# Patient Record
Sex: Female | Born: 1995 | Race: Black or African American | Hispanic: No | Marital: Single | Smoking: Never smoker
Health system: Southern US, Community
[De-identification: ages and names within clinical notes are randomized; demographics above are authoritative.]

## PROBLEM LIST (undated history)

## (undated) ENCOUNTER — Inpatient Hospital Stay (HOSPITAL_COMMUNITY): Payer: Self-pay

## (undated) DIAGNOSIS — J45909 Unspecified asthma, uncomplicated: Secondary | ICD-10-CM

---
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
## 2018-04-28 ENCOUNTER — Encounter (HOSPITAL_COMMUNITY): Payer: Self-pay | Admitting: *Deleted

## 2018-04-28 ENCOUNTER — Inpatient Hospital Stay (HOSPITAL_COMMUNITY)
Admission: AD | Admit: 2018-04-28 | Discharge: 2018-04-28 | Disposition: A | Discharge status: Home / Self Care | Payer: Medicaid Other | Attending: Obstetrics & Gynecology | Admitting: Obstetrics & Gynecology

## 2018-04-28 DIAGNOSIS — J101 Influenza due to other identified influenza virus with other respiratory manifestations: Secondary | ICD-10-CM

## 2018-04-28 DIAGNOSIS — J111 Influenza due to unidentified influenza virus with other respiratory manifestations: Secondary | ICD-10-CM | POA: Insufficient documentation

## 2018-04-28 DIAGNOSIS — Z3689 Encounter for other specified antenatal screening: Secondary | ICD-10-CM

## 2018-04-28 DIAGNOSIS — O26893 Other specified pregnancy related conditions, third trimester: Secondary | ICD-10-CM

## 2018-04-28 DIAGNOSIS — R05 Cough: Secondary | ICD-10-CM | POA: Diagnosis present

## 2018-04-28 DIAGNOSIS — O99513 Diseases of the respiratory system complicating pregnancy, third trimester: Secondary | ICD-10-CM | POA: Insufficient documentation

## 2018-04-28 DIAGNOSIS — Z3A34 34 weeks gestation of pregnancy: Secondary | ICD-10-CM | POA: Diagnosis not present

## 2018-04-28 LAB — URINALYSIS, ROUTINE W REFLEX MICROSCOPIC
Bilirubin Urine: NEGATIVE
GLUCOSE, UA: NEGATIVE mg/dL
Hgb urine dipstick: NEGATIVE
Ketones, ur: 5 mg/dL — AB
Nitrite: NEGATIVE
PH: 6 (ref 5.0–8.0)
Protein, ur: NEGATIVE mg/dL
Specific Gravity, Urine: 1.021 (ref 1.005–1.030)

## 2018-04-28 LAB — INFLUENZA PANEL BY PCR (TYPE A & B)
Influenza A By PCR: NEGATIVE
Influenza B By PCR: POSITIVE — AB

## 2018-04-28 LAB — GROUP A STREP BY PCR: GROUP A STREP BY PCR: NOT DETECTED

## 2018-04-28 MED ORDER — OSELTAMIVIR PHOSPHATE 75 MG PO CAPS: 75.0000 mg | ORAL_CAPSULE | Freq: Two times a day (BID) | ORAL | 0 refills | Status: AC

## 2018-04-28 NOTE — Discharge Instructions (Signed)
Pregnancy and Influenza    Influenza, also called the flu, is an infection of the lungs and airways (respiratory tract). If you are pregnant, you are more likely to catch the flu. You are also more likely to have a more serious case of the flu. This is because pregnancy causes changes to your body's disease-fighting system (immune system), heart, and lungs. If you develop a bad case of the flu, especially with a high fever, this can cause problems for you and your developing baby.  How do people get the flu?  The flu is caused by a type of germ called a virus. It spreads when virus particles get passed from person to person by:   Being near a sick person who is coughing or sneezing.   Touching something that has the virus on it and then touching your mouth, nose, or face.  The influenza virus is most common during the fall and winter.  How can I protect myself against the flu?   Get a flu shot. The best way to prevent the flu is to get a flu shot before flu season starts. The flu shot is not dangerous for your developing baby. It may even help protect your baby from the flu for up to 6 months after birth.   Wash your hands often with soap and warm water. If soap and water are not available, use hand sanitizer.   Do not come in close contact with sick people.   Do not share food, drinks, or utensils with other people.   Avoid touching your eyes, nose, and mouth.   Clean frequently used surfaces at home, school, or work.   Practice healthy lifestyle habits, such as:  ? Eating a healthy, balanced diet.  ? Drinking plenty of fluids.  ? Exercising regularly or as told by your health care provider.  ? Sleeping 7-9 hours each night.  ? Finding ways to manage stress.  What should I do if I have flu symptoms?   If you have any symptoms of the flu, even after getting a flu shot, contact your health care provider right away.   To reduce fever, take over-the-counter acetaminophen as told by your health care  provider.   If you have the flu, you may get antiviral medicine to keep the flu from becoming severe and to shorten how long it lasts.   Avoid spreading the flu to others:  ? Stay home until you are well.  ? Cover your nose and mouth when you cough or sneeze.  ? Wash your hands often.  Follow these instructions at home:   Take over-the-counter and prescription medicines only as told by your health care provider. Do not take any medicine, including cold or flu medicine, unless your health care provider tells you to do so.   If you were prescribed antiviral medicine, take it as told by your health care provider. Do not stop taking the antiviral medicine even if you start to feel better.   Eat a nutrient-rich diet that includes fresh fruits and vegetables, whole grains, lean protein, and low-fat dairy.   Drink enough fluid to keep your urine clear or pale yellow.   Get plenty of rest.  Contact a health care provider if:   You have fever or chills.   You have a cough, sore throat, or stuffy nose.   You have worsening or unusual:  ? Muscle aches.  ? Headache.  ? Tiredness.  ? Loss of appetite.   You   have vomiting or diarrhea.  Get help right away if:   You have trouble breathing.   You have chest pain.   You have abdominal pain.   You begin to have labor pains.   You have a fever that does not go down 24 hours after you take medicine.   You do not feel your baby move.   You have diarrhea or vomiting that will not go away.   You have dizziness or confusion.   Your symptoms do not improve, even with treatment.  Summary   If you are pregnant, you are more likely to catch the flu. You are also more likely to have a more serious case of the flu.   If you have flu-like symptoms, call your health care provider right away. If you develop a bad case of the flu, especially with a high fever, this can be dangerous for your developing baby.   The best way to prevent the flu is to get a flu shot before flu  season starts. The flu shot is not dangerous for your developing baby.   If you have the flu and were prescribed antiviral medicine, take it as told by your health care provider.  This information is not intended to replace advice given to you by your health care provider. Make sure you discuss any questions you have with your health care provider.  Document Released: 02/20/2008 Document Revised: 06/15/2016 Document Reviewed: 06/15/2016  [employer]vier Interactive Patient Education  2019 [employer]

## 2018-04-28 NOTE — MAU Provider Note (Signed)
History     CSN: [id number]  Arrival date and time: 04/28/18 1400   First Provider Initiated Contact with Patient 04/28/18 1503      Chief Complaint  Patient presents with  . Chills  . Cough  . Sore Throat  . Nasal Congestion   G2P0100 @34  wks here with cold sx. Reports non-productive cough, nasal congestion, chills, and body aches x2 days. Two family members have same sx. Has felt hot but did not check temp. Reports decreased FM today but felling good movement since arrival. Took Tylenol yesterday.   OB History    Gravida  2   Para  1   Term  0   Preterm  1   AB  0   Living  0     SAB  0   TAB  0   Ectopic  0   Multiple  0   Live Births  1           Past Medical History:  Diagnosis Date  . Asthma     Past Surgical History:  Procedure Laterality Date  . CESAREAN SECTION N/A 07/09/2016   Procedure: CESAREAN SECTION;  Surgeon: Adam PhenixJames G Arnold, MD;  [facility];  Service: Obstetrics;  Laterality: N/A;    Family History  Problem Relation Age of Onset  . Diabetes Paternal Grandmother     Social History   Tobacco Use  . Smoking status: Never Smoker  . Smokeless tobacco: Never Used  Substance Use Topics  . Alcohol use: No  . Drug use: No    Allergies: No Known Allergies  No medications prior to admission.    Review of Systems  Constitutional: Positive for chills. Negative for fever.  HENT: Positive for congestion and sore throat. Negative for ear pain.   Respiratory: Positive for cough and shortness of breath.   Neurological: Negative for headaches.   Physical Exam   Blood pressure 111/69, pulse (!) 122, temperature 100.1 F (37.8 C), temperature source Oral, resp. rate 18, height 5\' 5"  (1.651 m), weight 62.1 kg, SpO2 100 %.  Physical Exam  Constitutional: She is oriented to person, place, and time. She appears well-developed and well-nourished. No distress.  HENT:  Head: Normocephalic and atraumatic.  Right Ear:  Hearing, tympanic membrane, external ear and ear canal normal.  Left Ear: Hearing, tympanic membrane, external ear and ear canal normal.  Nose: Nose normal.  Mouth/Throat: Uvula is midline, oropharynx is clear and moist and mucous membranes are normal.  Neck: Normal range of motion.  Cardiovascular: Regular rhythm and normal heart sounds.  Mild tachy  Respiratory: Effort normal and breath sounds normal. No respiratory distress. She has no wheezes. She has no rales.  Musculoskeletal: Normal range of motion.  Neurological: She is alert and oriented to person, place, and time.  Skin: Skin is warm and dry.  Psychiatric: She has a normal mood and affect.  EFM: 155 bpm, mod variability, + accels, no decels Toco: none  Results for orders placed or performed during the hospital encounter of 04/28/18 (from the past 24 hour(s))  Urinalysis, Routine w reflex microscopic     Status: Abnormal   Collection Time: 04/28/18  2:28 PM  Result Value Ref Range   Color, Urine YELLOW YELLOW   APPearance HAZY (A) CLEAR   Specific Gravity, Urine 1.021 1.005 - 1.030   pH 6.0 5.0 - 8.0   Glucose, UA NEGATIVE NEGATIVE mg/dL   Hgb urine dipstick NEGATIVE NEGATIVE   Bilirubin Urine  NEGATIVE NEGATIVE   Ketones, ur 5 (A) NEGATIVE mg/dL   Protein, ur NEGATIVE NEGATIVE mg/dL   Nitrite NEGATIVE NEGATIVE   Leukocytes, UA LARGE (A) NEGATIVE   RBC / HPF 6-10 0 - 5 RBC/hpf   WBC, UA 21-50 0 - 5 WBC/hpf   Bacteria, UA RARE (A) NONE SEEN   Squamous Epithelial / LPF 6-10 0 - 5   Mucus PRESENT   Influenza panel by PCR (type A & B)     Status: Abnormal   Collection Time: 04/28/18  3:13 PM  Result Value Ref Range   Influenza A By PCR NEGATIVE NEGATIVE   Influenza B By PCR POSITIVE (A) NEGATIVE  Group A Strep by PCR     Status: None   Collection Time: 04/28/18  3:13 PM  Result Value Ref Range   Group A Strep by PCR NOT DETECTED NOT DETECTED   MAU Course  Procedures  MDM Labs ordered and reviewed. Flu B pos, will  treat with Tamiflu. Stable for discharge home.   Assessment and Plan   1. [redacted] weeks gestation of pregnancy   2. Influenza B   3. NST (non-stress test) reactive    Discharge home Follow up in OB office as scheduled Rx Tamiflu Tylenol prn Hydrate Rest Return for worsening sx  Allergies as of 04/28/2018   No Known Allergies     Medication List    STOP taking these medications   amLODipine 10 MG tablet Commonly known as:  NORVASC   ibuprofen 600 MG tablet Commonly known as:  ADVIL,MOTRIN   Oxycodone HCl 10 MG Tabs   senna-docusate 8.6-50 MG tablet Commonly known as:  Senokot-S     TAKE these medications   albuterol 108 (90 Base) MCG/ACT inhaler Commonly known as:  PROVENTIL HFA;VENTOLIN HFA Inhale 1-2 puffs into the lungs every 6 (six) hours as needed for wheezing or shortness of breath.   calcium carbonate 500 MG chewable tablet Commonly known as:  TUMS - dosed in mg elemental calcium Chew 2 tablets by mouth as needed for indigestion or heartburn.   OB COMPLETE PETITE 35-5-1-200 MG Caps Take 1 tablet by mouth daily.   oseltamivir 75 MG capsule Commonly known as:  TAMIFLU Take 1 capsule (75 mg total) by mouth 2 (two) times daily for 5 days.      Donette LarryMelanie Deyra Perdomo, CNM 04/28/2018, 6:10 PM

## 2018-04-28 NOTE — MAU Note (Signed)
Patient reports having sore throat, nasal congestion, cough, chills, and body aches since 04/26/18.  States her father and brother have also had these symptoms.  Reports decreased fetal movement as well.  Denies getting flu shot.

## 2018-08-15 IMAGING — US US MFM UA CORD DOPPLER
1 series · 15 of 28 positions shown · non-contrast
Comparison: none

[Series 1: us mfm ua cord doppler · 34 acquisitions, 15 frames shown]
[im 1/34]
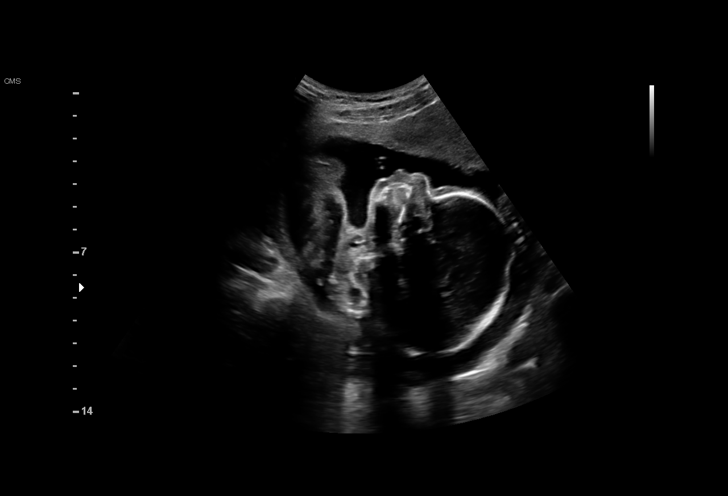
[im 3/34]
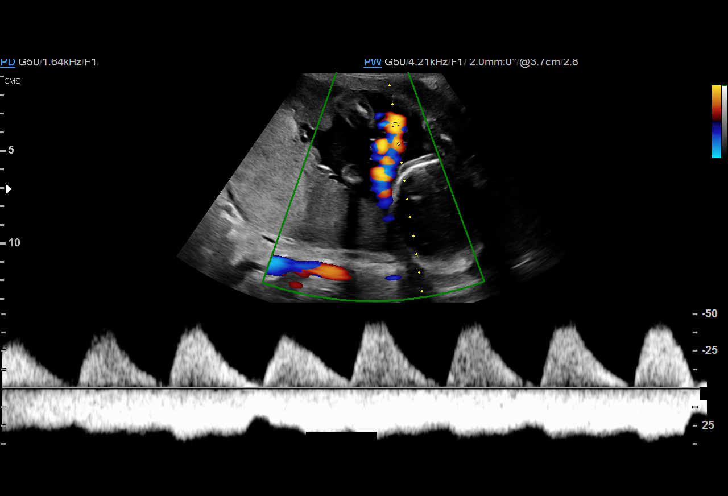
[im 5/34]
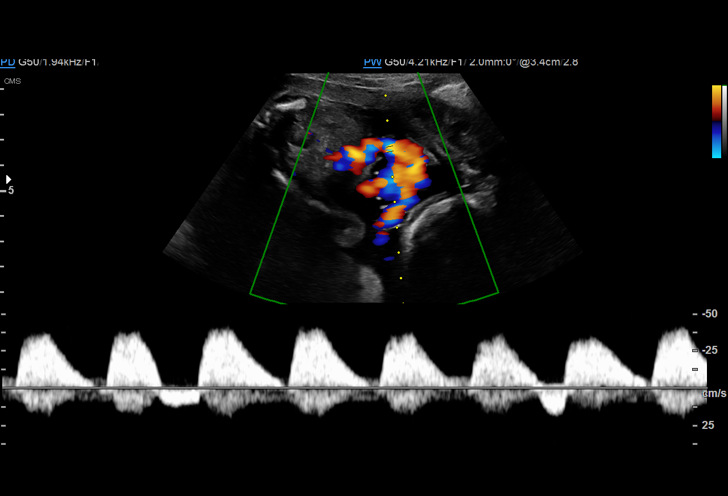
[im 8/34]
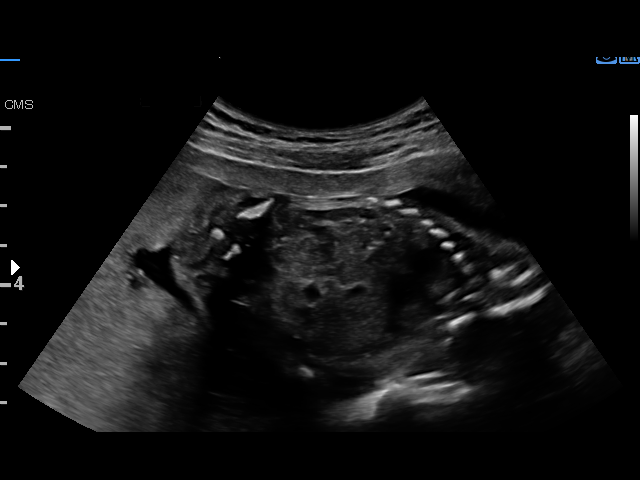
[im 10/34]
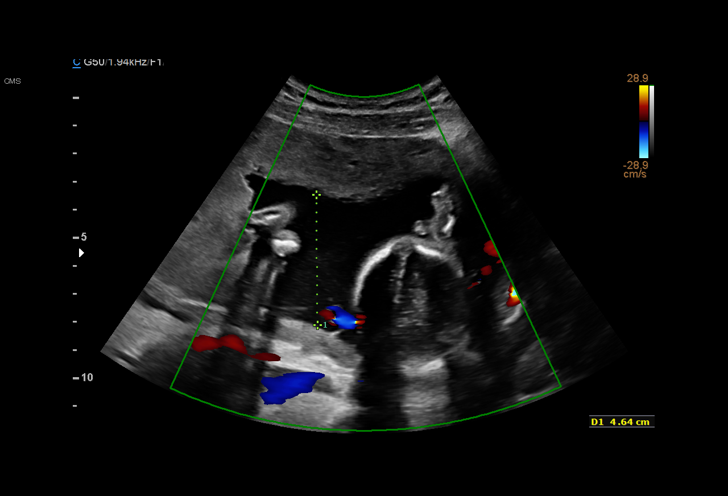
[im 13/34]
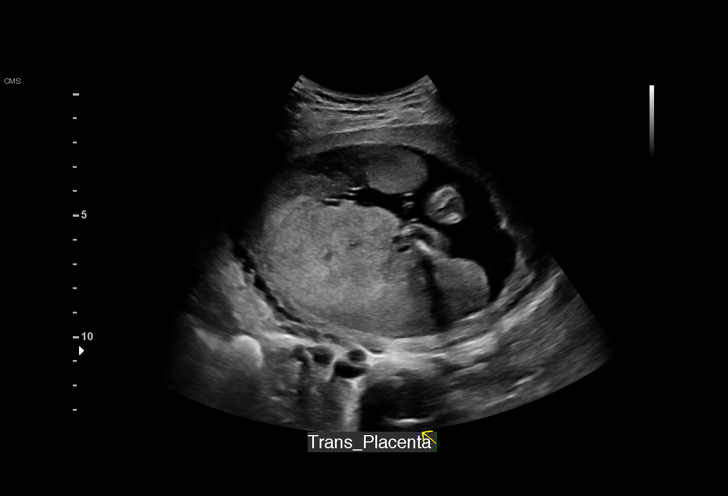
[im 15/34]
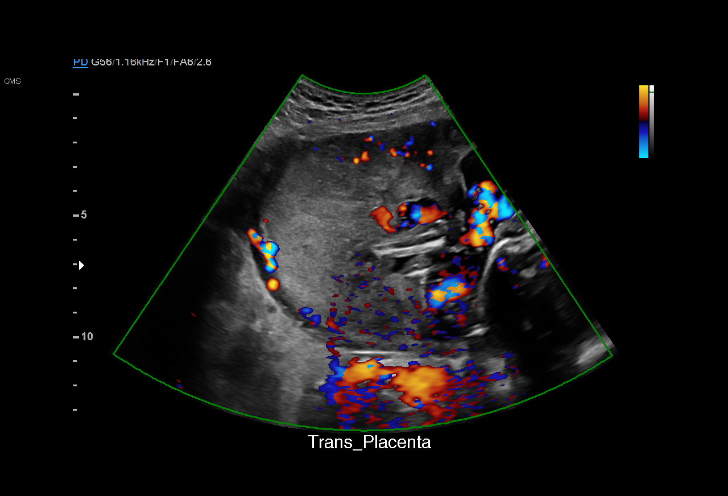
[im 18/34]
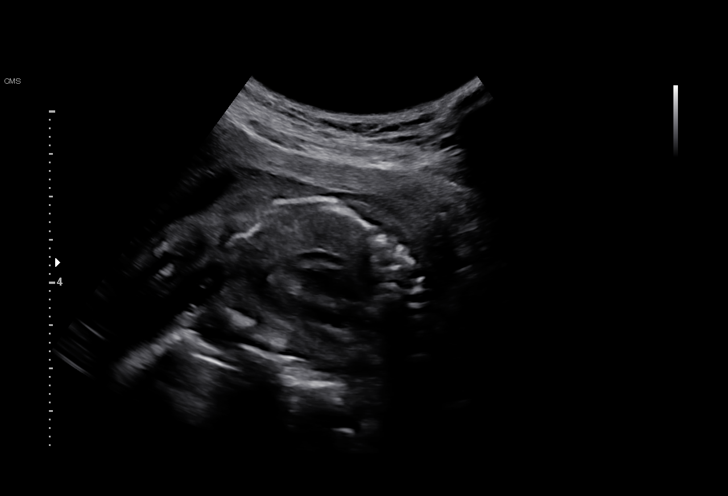
[im 19/34]
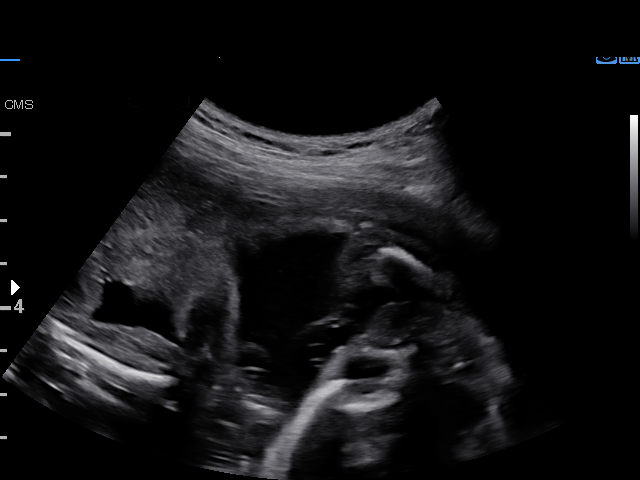
[im 21/34]
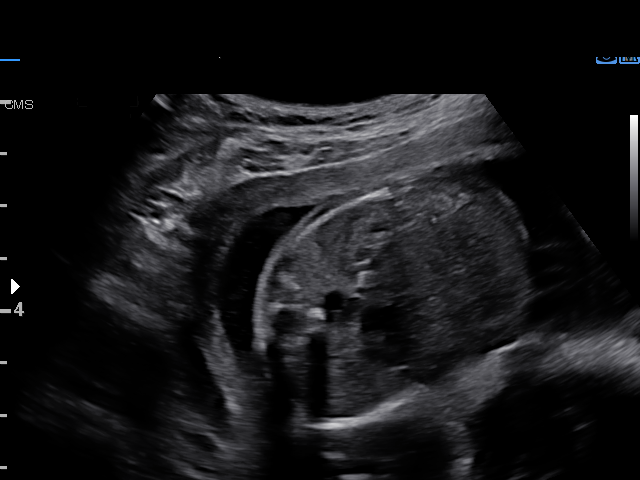
[im 24/34]
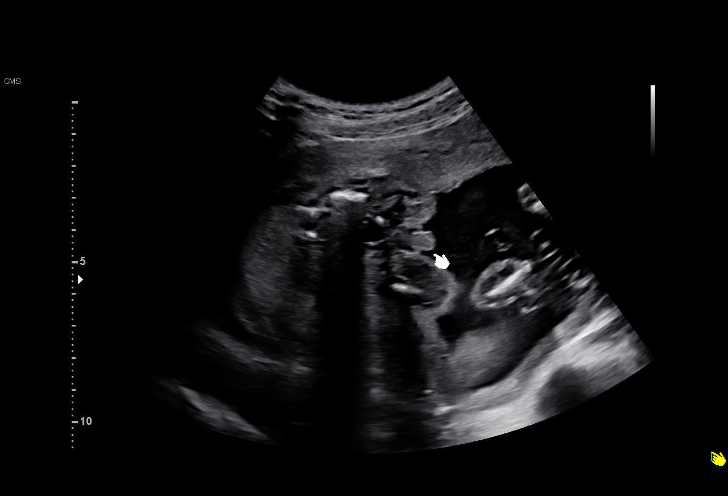
[im 26/34]
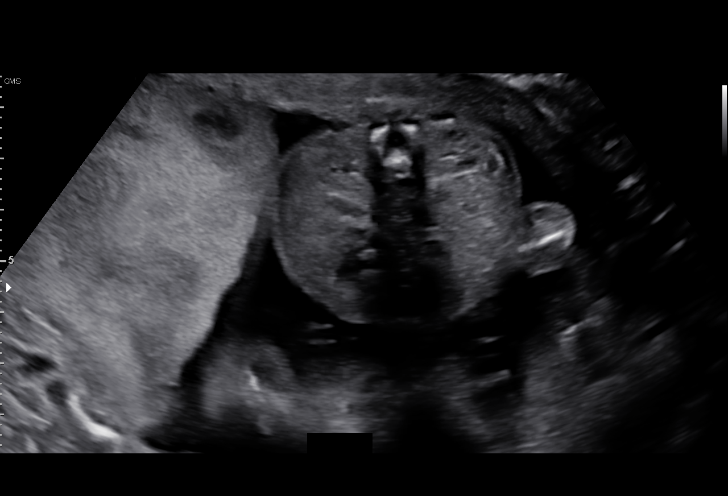
[im 29/34]
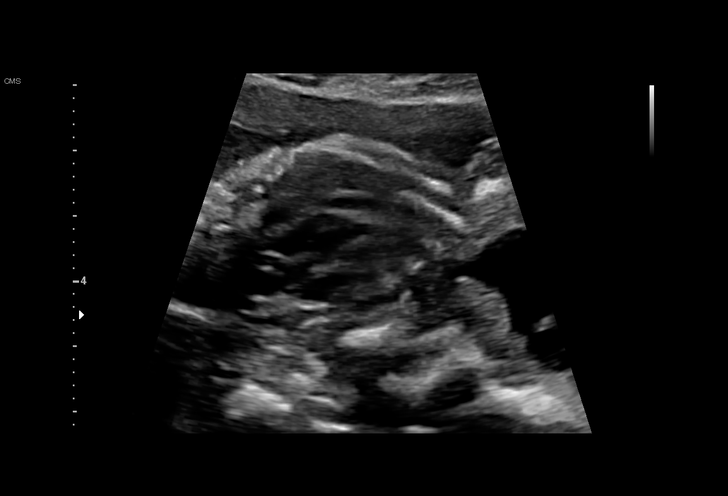
[im 31/34]
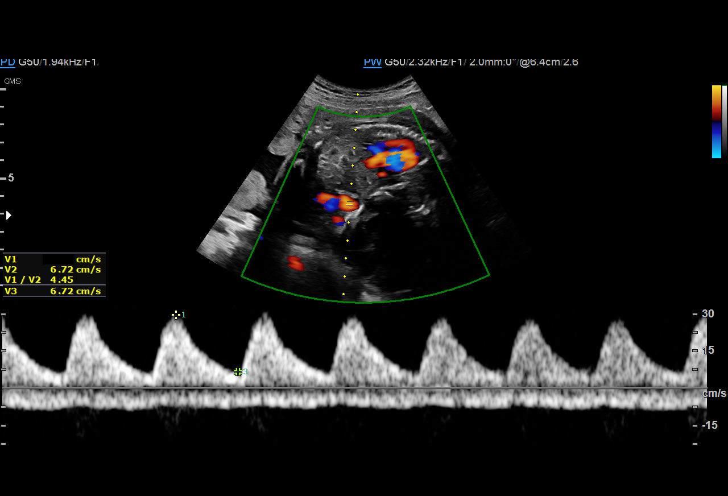
[im 34/34]
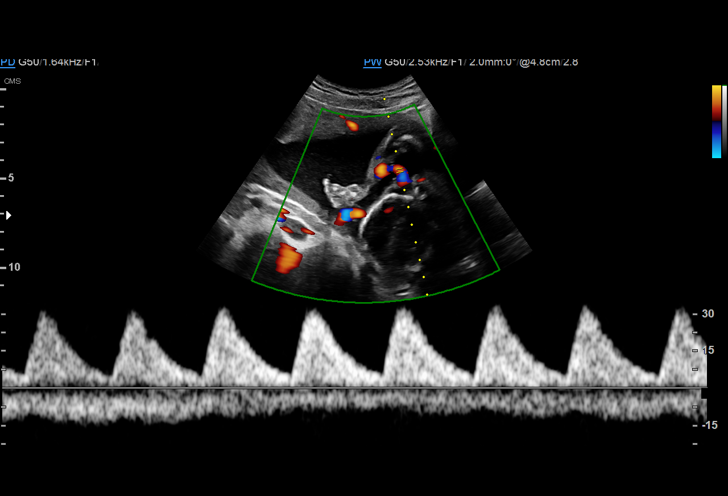

[15 of 28 positions shown; findings below may reference images not displayed]

Attending:        Lijesh Alico       Secondary Phy.:   3rd Nursing- 3rd
floor [phone]

1  NAZARETH JUMPER          [phone]      [phone]     [phone]
Indications

25 weeks gestation of pregnancy
Maternal care for known or suspected poor
fetal growth, second trimester, not applicable
or unspecified; low risk NIPS x 2; neg
TORCH titers
Severe preeclampsia, second trimester
OB History

Gravidity:    1
Fetal Evaluation

Num Of Fetuses:     1
Fetal Heart         143
Rate(bpm):
Cardiac Activity:   Observed
Presentation:       Cephalic
Placenta:           Anterior, above cervical os
P. Cord Insertion:  Previously Visualized

Amniotic Fluid
AFI FV:      Subjectively within normal limits

Largest Pocket(cm)
4.6
Gestational Age

LMP:           25w 6d       Date:   12/16/15                 EDD:   09/21/16
Clinical EDD:  26w 3d                                        EDD:   09/17/16
Best:          25w 6d    Det. By:   LMP  (12/16/15)          EDD:   09/21/16
Doppler - Fetal Vessels

Umbilical Artery
S/D     %tile                                            ADFV
6.22    > 97.5                                              Yes

Impression

SIUP at 25+6 weeks
Cephalic presentation
Severe fetal growth restriction
Low normal amniotic fluid volume
UA dopplers: elevated S/D ratio with some absent EDF; rare
reverse EDF
Recommendations

Every other day UA dopplers
Growth US on [DATE]
Continue tracings 3 times a day

## 2018-09-01 ENCOUNTER — Encounter (HOSPITAL_COMMUNITY): Payer: Self-pay
